# Patient Record
Sex: Male | Born: 1955 | Race: Black or African American | Hispanic: No | Marital: Single | Smoking: Never smoker
Health system: Southern US, Community
[De-identification: ages and names within clinical notes are randomized; demographics above are authoritative.]

---

## 2015-09-04 ENCOUNTER — Emergency Department (HOSPITAL_COMMUNITY): Payer: PRIVATE HEALTH INSURANCE

## 2015-09-04 ENCOUNTER — Encounter (HOSPITAL_COMMUNITY): Payer: Self-pay | Admitting: Emergency Medicine

## 2015-09-04 DIAGNOSIS — J45901 Unspecified asthma with (acute) exacerbation: Secondary | ICD-10-CM | POA: Diagnosis not present

## 2015-09-04 DIAGNOSIS — I10 Essential (primary) hypertension: Secondary | ICD-10-CM | POA: Insufficient documentation

## 2015-09-04 DIAGNOSIS — R05 Cough: Secondary | ICD-10-CM | POA: Diagnosis present

## 2015-09-04 LAB — BASIC METABOLIC PANEL
ANION GAP: 6 (ref 5–15)
BUN: 18 mg/dL (ref 6–20)
CALCIUM: 9.7 mg/dL (ref 8.9–10.3)
CO2: 29 mmol/L (ref 22–32)
Chloride: 106 mmol/L (ref 101–111)
Creatinine, Ser: 1.11 mg/dL (ref 0.61–1.24)
GLUCOSE: 105 mg/dL — AB (ref 65–99)
Potassium: 3.4 mmol/L — ABNORMAL LOW (ref 3.5–5.1)
Sodium: 141 mmol/L (ref 135–145)

## 2015-09-04 LAB — CBC
HCT: 48.8 % (ref 39.0–52.0)
HEMOGLOBIN: 15.4 g/dL (ref 13.0–17.0)
MCH: 27.6 pg (ref 26.0–34.0)
MCHC: 31.6 g/dL (ref 30.0–36.0)
MCV: 87.6 fL (ref 78.0–100.0)
Platelets: 157 10*3/uL (ref 150–400)
RBC: 5.57 MIL/uL (ref 4.22–5.81)
RDW: 13 % (ref 11.5–15.5)
WBC: 6.9 10*3/uL (ref 4.0–10.5)

## 2015-09-04 LAB — I-STAT TROPONIN, ED: TROPONIN I, POC: 0.01 ng/mL (ref 0.00–0.08)

## 2015-09-04 NOTE — ED Notes (Signed)
Pt speaks [place] (n[place]ian). Friends at bedside to interpret. Pt has had cough x 2 months with chest pain. Pt sts dry cough. Friends report patient gets SOB frequently at home. Resp e/u at this time. PT also noted to be hypertensive 269/149 in triage with no known history.

## 2015-09-05 ENCOUNTER — Emergency Department (HOSPITAL_COMMUNITY)
Admission: EM | Admit: 2015-09-05 | Discharge: 2015-09-05 | Disposition: A | Discharge status: Home / Self Care | Payer: PRIVATE HEALTH INSURANCE | Attending: Emergency Medicine | Admitting: Emergency Medicine

## 2015-09-05 DIAGNOSIS — J45901 Unspecified asthma with (acute) exacerbation: Secondary | ICD-10-CM

## 2015-09-05 DIAGNOSIS — I10 Essential (primary) hypertension: Secondary | ICD-10-CM

## 2015-09-05 MED ORDER — ALBUTEROL SULFATE (2.5 MG/3ML) 0.083% IN NEBU
2.5000 mg | INHALATION_SOLUTION | Freq: Once | RESPIRATORY_TRACT | Status: AC
  Administered 2015-09-05: 2.5 mg via RESPIRATORY_TRACT
  Filled 2015-09-05: qty 3

## 2015-09-05 MED ORDER — HYDROCHLOROTHIAZIDE 25 MG PO TABS: 25.0000 mg | ORAL_TABLET | Freq: Every day | ORAL | Status: DC

## 2015-09-05 MED ORDER — ALBUTEROL SULFATE HFA 108 (90 BASE) MCG/ACT IN AERS: 1.0000 | INHALATION_SPRAY | Freq: Four times a day (QID) | RESPIRATORY_TRACT | Status: AC | PRN

## 2015-09-05 MED ORDER — PREDNISONE 50 MG PO TABS: ORAL_TABLET | ORAL | Status: DC

## 2015-09-05 MED ORDER — HYDROCHLOROTHIAZIDE 25 MG PO TABS
25.0000 mg | ORAL_TABLET | Freq: Every day | ORAL | Status: DC
  Administered 2015-09-05: 25 mg via ORAL
  Filled 2015-09-05: qty 1

## 2015-09-05 MED ORDER — PREDNISONE 20 MG PO TABS
60.0000 mg | ORAL_TABLET | Freq: Once | ORAL | Status: AC
  Administered 2015-09-05: 60 mg via ORAL
  Filled 2015-09-05: qty 3

## 2015-09-05 MED ORDER — POTASSIUM CHLORIDE CRYS ER 10 MEQ PO TBCR: 10.0000 meq | EXTENDED_RELEASE_TABLET | Freq: Every day | ORAL | Status: AC

## 2015-09-05 MED ORDER — IPRATROPIUM-ALBUTEROL 0.5-2.5 (3) MG/3ML IN SOLN
3.0000 mL | Freq: Once | RESPIRATORY_TRACT | Status: AC
  Administered 2015-09-05: 3 mL via RESPIRATORY_TRACT
  Filled 2015-09-05: qty 3

## 2015-09-05 MED ORDER — ALBUTEROL SULFATE HFA 108 (90 BASE) MCG/ACT IN AERS
2.0000 | INHALATION_SPRAY | Freq: Once | RESPIRATORY_TRACT | Status: AC
  Administered 2015-09-05: 2 via RESPIRATORY_TRACT
  Filled 2015-09-05: qty 6.7

## 2015-09-05 NOTE — ED Notes (Signed)
Patient ambulated hallway with no difficulty.  O2 saturation remained at 99%

## 2015-09-05 NOTE — Discharge Instructions (Signed)
Asthma, Adult Asthma is a condition of the lungs in which the airways tighten and narrow. Asthma can make it hard to breathe. Asthma cannot be cured, but medicine and lifestyle changes can help control it. Asthma may be started (triggered) by:  Animal skin flakes (dander).  Dust.  Cockroaches.  Pollen.  Mold.  Smoke.  Cleaning products.  Hair sprays or aerosol sprays.  Paint fumes or strong smells.  Cold air, weather changes, and winds.  Crying or laughing hard.  Stress.  Certain medicines or drugs.  Foods, such as dried fruit, potato chips, and sparkling grape juice.  Infections or conditions (colds, flu).  Exercise.  Certain medical conditions or diseases.  Exercise or tiring activities. HOME CARE   Take medicine as told by your doctor.  Use a peak flow meter as told by your doctor. A peak flow meter is a tool that measures how well the lungs are working.  Record and keep track of the peak flow meter's readings.  Understand and use the asthma action plan. An asthma action plan is a written plan for taking care of your asthma and treating your attacks.  To help prevent asthma attacks:  Do not smoke. Stay away from secondhand smoke.  Change your heating and air conditioning filter often.  Limit your use of fireplaces and wood stoves.  Get rid of pests (such as roaches and mice) and their droppings.  Throw away plants if you see mold on them.  Clean your floors. Dust regularly. Use cleaning products that do not smell.  Have someone vacuum when you are not home. Use a vacuum cleaner with a HEPA filter if possible.  Replace carpet with wood, tile, or vinyl flooring. Carpet can trap animal skin flakes and dust.  Use allergy-proof pillows, mattress covers, and box spring covers.  Wash bed sheets and blankets every week in hot water and dry them in a dryer.  Use blankets that are made of polyester or cotton.  Clean bathrooms and kitchens with bleach.  If possible, have someone repaint the walls in these rooms with mold-resistant paint. Keep out of the rooms that are being cleaned and painted.  Wash hands often. GET HELP IF:  You have make a whistling sound when breaking (wheeze), have shortness of breath, or have a cough even if taking medicine to prevent attacks.  The colored mucus you cough up (sputum) is thicker than usual.  The colored mucus you cough up changes from clear or white to yellow, green, gray, or bloody.  You have problems from the medicine you are taking such as:  A rash.  Itching.  Swelling.  Trouble breathing.  You need reliever medicines more than 2-3 times a week.  Your peak flow measurement is still at 50-79% of your personal best after following the action plan for 1 hour.  You have a fever. GET HELP RIGHT AWAY IF:   You seem to be worse and are not responding to medicine during an asthma attack.  You are short of breath even at rest.  You get short of breath when doing very little activity.  You have trouble eating, drinking, or talking.  You have chest pain.  You have a fast heartbeat.  Your lips or fingernails start to turn blue.  You are light-headed, dizzy, or faint.  Your peak flow is less than 50% of your personal best.   This information is not intended to replace advice given to you by your health care provider. Make sure   you discuss any questions you have with your health care provider.   Document Released: 09/07/2007 Document Revised: 12/10/2014 Document Reviewed: 10/18/2012 Elsevier Interactive Patient Education 2016 Elsevier Inc.  

## 2015-09-05 NOTE — ED Notes (Signed)
Patient Alert and oriented X4. Stable and ambulatory. Patient and friend verbalized understanding of the discharge instructions.  Patient belongings were taken by the patient. Tried to use Interpreter phone but did not have interpreter available.  MD at bedside for discharge instructions.

## 2015-09-05 NOTE — ED Provider Notes (Signed)
CSN: [id number]     Arrival date & time 09/04/15  2214 History  By signing my name below, I, Doreatha Martin, attest that this documentation has been prepared under the direction and in the presence of Zadie Rhine, MD. Electronically Signed: Doreatha Martin, ED Scribe. 09/05/2015. 12:35 AM.    Chief Complaint  Patient presents with  . Cough  . Shortness of Breath  . Chest Pain   Patient is a 60 y.o. male presenting with cough and chest pain. The history is provided by the patient. A language interpreter was used Designer, jewellery ([place]) Interpreter # [phone]).  Cough Cough characteristics:  Dry Severity:  Mild Onset quality:  Gradual Duration:  4 weeks Timing:  Intermittent Chronicity:  Recurrent Smoker: no   Context: with activity   Relieved by:  None tried Worsened by:  Activity Ineffective treatments:  None tried Associated symptoms: chest pain and shortness of breath   Associated symptoms: no fever and no headaches   Risk factors: no recent travel   Chest Pain Associated symptoms: cough (secondary to cough) and shortness of breath   Associated symptoms: no fever and no headache    HPI Comments: Gary Duncan is a 60 y.o. male with h/o asthma (unmedicated) who presents to the Emergency Department complaining of intermittent dry cough for a month with associated exertional SOB. Pt also complains of chest tenderness secondary to coughing. Pt reports he has taken Advil intermittently for his symptoms with mild to moderate relief. He reports his current symptoms are similar to prior asthma exacerbations. Pt is a nonsmoker. No recent international travel in the past month. Otherwise healthy. No daily medications. Pt denies fever, HA, abdominal pain, leg swelling, hemoptysis, CP when not coughing.   PMH - Asthma Soc hx - no recent international travel Social History  Substance Use Topics  . Smoking status: Never Smoker   . Smokeless tobacco: None  . Alcohol Use: No    Review of Systems   Constitutional: Negative for fever.  Respiratory: Positive for cough (secondary to cough) and shortness of breath.   Cardiovascular: Positive for chest pain. Negative for leg swelling.  Neurological: Negative for headaches.  All other systems reviewed and are negative.  Allergies  Review of patient's allergies indicates no known allergies.  Home Medications   Prior to Admission medications   Not on File   BP 269/149 mmHg  Pulse 103  Temp(Src) 98.3 F (36.8 C) (Oral)  Resp 16  SpO2 97% Physical Exam CONSTITUTIONAL: Well developed/well nourished HEAD: Normocephalic/atraumatic EYES: EOMI/PERRL ENMT: Mucous membranes moist NECK: supple no meningeal signs SPINE/BACK:entire spine nontender CV: S1/S2 noted, no murmurs/rubs/gallops noted LUNGS: Wheezing bilaterally.  ABDOMEN: soft, nontender, no rebound or guarding, bowel sounds noted throughout abdomen GU:no cva tenderness NEURO: Pt is awake/alert/appropriate, moves all extremitiesx4.  No facial droop.   EXTREMITIES: pulses normal/equal, full ROM; no lower extremity edema.  SKIN: warm, color normal PSYCH: no abnormalities of mood noted, alert and oriented to situation   ED Course  Procedures  Medications  hydrochlorothiazide (HYDRODIURIL) tablet 25 mg (25 mg Oral Given 09/05/15 0103)  ipratropium-albuterol (DUONEB) 0.5-2.5 (3) MG/3ML nebulizer solution 3 mL (3 mLs Nebulization Given 09/05/15 0056)  albuterol (PROVENTIL) (2.5 MG/3ML) 0.083% nebulizer solution 2.5 mg (2.5 mg Nebulization Given 09/05/15 0056)  predniSONE (DELTASONE) tablet 60 mg (60 mg Oral Given 09/05/15 0056)  albuterol (PROVENTIL HFA;VENTOLIN HFA) 108 (90 Base) MCG/ACT inhaler 2 puff (2 puffs Inhalation Given 09/05/15 0207)    DIAGNOSTIC STUDIES: Oxygen Saturation is 97% on  RA, normal by my interpretation.    COORDINATION OF CARE: 12:31 AM Discussed treatment plan with pt at bedside which includes lab work, CXR, EKG and pt agreed to plan. Will order dose of HCTZ,  Duoneb tx, Proventil and Prednisone prior to d/c. Will provide PCP resources for f/u.  2:12 AM Pt improved Wheezing improved but still present No distress noted I will start him on BP meds due to his untreated HTN.  Will also start KCL for 5 days due to mild hypokalemia and likely this will worsen with HCTZ He has no signs of hypertensive emergency, no distress noted Albuterol/prednisone also started [facility] Discussed return precautions At time of discharge, [place] interpreter not available by interpreter phone and patient does not speak any other languages.  His friend at bedside was able to help interpret   Labs Review Labs Reviewed  BASIC METABOLIC PANEL - Abnormal; Notable for the following:    Potassium 3.4 (*)    Glucose, Bld 105 (*)    All other components within normal limits  CBC  I-STAT TROPOININ, ED    Imaging Review Dg Chest 2 View  09/04/2015  CLINICAL DATA:  Chest pain EXAM: CHEST  2 VIEW COMPARISON:  None. FINDINGS: Normal heart size. Mild prominence of the ascending aorta without definitive enlargement. Nipple shadows are noted. There is no edema, consolidation, effusion, or pneumothorax. No osseous finding to explain acute chest pain. IMPRESSION: No evidence of active disease. Electronically Signed   By: Marnee SpringJonathon  Watts M.D.   On: 09/04/2015 23:06   I have personally reviewed and evaluated these images and lab results as part of my medical decision-making.   EKG Interpretation   Date/Time:  Friday September 04 2015 22:24:44 EDT Ventricular Rate:  95 PR Interval:  146 QRS Duration: 84 QT Interval:  356 QTC Calculation: 447 R Axis:   61 Text Interpretation:  Normal sinus rhythm Possible Left atrial enlargement  Left ventricular hypertrophy with repolarization abnormality Abnormal ECG  No previous ECGs available Confirmed by Bebe ShaggyWICKLINE  MD, Dorinda HillNALD ([id number]) on  09/04/2015 11:01:38 PM      MDM   Final diagnoses:  Asthma attack   Essential hypertension    Nursing notes including past medical history and social history reviewed and considered in documentation xrays/imaging reviewed by myself and considered during evaluation Labs/vital reviewed myself and considered during evaluation   I personally performed the services described in this documentation, which was scribed in my presence. The recorded information has been reviewed and is accurate.       Zadie Rhineonald Kyle Luppino, MD 09/05/15 [phone]

## 2015-10-11 ENCOUNTER — Emergency Department (HOSPITAL_COMMUNITY)
Admission: EM | Admit: 2015-10-11 | Discharge: 2015-10-11 | Disposition: A | Discharge status: Home / Self Care | Payer: PRIVATE HEALTH INSURANCE | Attending: Physician Assistant | Admitting: Physician Assistant

## 2015-10-11 ENCOUNTER — Emergency Department (HOSPITAL_COMMUNITY): Payer: PRIVATE HEALTH INSURANCE

## 2015-10-11 ENCOUNTER — Encounter (HOSPITAL_COMMUNITY): Payer: Self-pay | Admitting: Emergency Medicine

## 2015-10-11 DIAGNOSIS — R059 Cough, unspecified: Secondary | ICD-10-CM

## 2015-10-11 DIAGNOSIS — R0602 Shortness of breath: Secondary | ICD-10-CM

## 2015-10-11 DIAGNOSIS — Z79899 Other long term (current) drug therapy: Secondary | ICD-10-CM | POA: Diagnosis not present

## 2015-10-11 DIAGNOSIS — J45901 Unspecified asthma with (acute) exacerbation: Secondary | ICD-10-CM | POA: Diagnosis not present

## 2015-10-11 DIAGNOSIS — R05 Cough: Secondary | ICD-10-CM

## 2015-10-11 DIAGNOSIS — R079 Chest pain, unspecified: Secondary | ICD-10-CM | POA: Diagnosis present

## 2015-10-11 LAB — BASIC METABOLIC PANEL
ANION GAP: 8 (ref 5–15)
BUN: 12 mg/dL (ref 6–20)
CALCIUM: 9.7 mg/dL (ref 8.9–10.3)
CO2: 24 mmol/L (ref 22–32)
Chloride: 107 mmol/L (ref 101–111)
Creatinine, Ser: 1.13 mg/dL (ref 0.61–1.24)
Glucose, Bld: 118 mg/dL — ABNORMAL HIGH (ref 65–99)
Potassium: 3.7 mmol/L (ref 3.5–5.1)
SODIUM: 139 mmol/L (ref 135–145)

## 2015-10-11 LAB — CBC
HCT: 46.7 % (ref 39.0–52.0)
HEMOGLOBIN: 15.2 g/dL (ref 13.0–17.0)
MCH: 28.7 pg (ref 26.0–34.0)
MCHC: 32.5 g/dL (ref 30.0–36.0)
MCV: 88.3 fL (ref 78.0–100.0)
Platelets: 165 10*3/uL (ref 150–400)
RBC: 5.29 MIL/uL (ref 4.22–5.81)
RDW: 13.1 % (ref 11.5–15.5)
WBC: 5.5 10*3/uL (ref 4.0–10.5)

## 2015-10-11 LAB — I-STAT TROPONIN, ED: TROPONIN I, POC: 0.01 ng/mL (ref 0.00–0.08)

## 2015-10-11 MED ORDER — PREDNISONE 20 MG PO TABS
60.0000 mg | ORAL_TABLET | Freq: Once | ORAL | Status: AC
  Administered 2015-10-11: 60 mg via ORAL
  Filled 2015-10-11: qty 3

## 2015-10-11 MED ORDER — PREDNISONE 20 MG PO TABS: 60.0000 mg | ORAL_TABLET | Freq: Every day | ORAL | Status: AC

## 2015-10-11 MED ORDER — ALBUTEROL SULFATE HFA 108 (90 BASE) MCG/ACT IN AERS
1.0000 | INHALATION_SPRAY | Freq: Once | RESPIRATORY_TRACT | Status: AC
  Administered 2015-10-11: 2 via RESPIRATORY_TRACT
  Filled 2015-10-11: qty 6.7

## 2015-10-11 MED ORDER — ALBUTEROL SULFATE HFA 108 (90 BASE) MCG/ACT IN AERS: 2.0000 | INHALATION_SPRAY | RESPIRATORY_TRACT | Status: DC | PRN

## 2015-10-11 MED ORDER — HYDROCHLOROTHIAZIDE 25 MG PO TABS: 25.0000 mg | ORAL_TABLET | Freq: Every day | ORAL | Status: AC

## 2015-10-11 MED ORDER — ALBUTEROL SULFATE (2.5 MG/3ML) 0.083% IN NEBU
2.5000 mg | INHALATION_SOLUTION | Freq: Once | RESPIRATORY_TRACT | Status: AC
  Administered 2015-10-11: 2.5 mg via RESPIRATORY_TRACT
  Filled 2015-10-11: qty 3

## 2015-10-11 NOTE — ED Notes (Addendum)
Pt c/o chest pain with non productive cough x 1 week. Pt speaks [place]. Pt does have a primary medical doctor and has only seen a doctor in his lifetime when he was last here.

## 2015-10-11 NOTE — Discharge Instructions (Signed)
Medications: Hydrochlorothiazide, albuterol, prednisone  Treatment: Take hydrochlorothiazide once daily as prescribed. Take prednisone as prescribed once daily for 5 days. Use albuterol inhaler every 4-6 hours as needed for shortness of breath, chest tightness, wheezing.  Follow-up: Please follow-up and establish care with a primary care provider as soon as possible by calling the number circled on your discharge paperwork. Please return to the emergency department if you develop any new or worsening symptoms.   Asthma, Adult Asthma is a recurring condition in which the airways tighten and narrow. Asthma can make it difficult to breathe. It can cause coughing, wheezing, and shortness of breath. Asthma episodes, also called asthma attacks, range from minor to life-threatening. Asthma cannot be cured, but medicines and lifestyle changes can help control it. CAUSES Asthma is believed to be caused by inherited (genetic) and environmental factors, but its exact cause is unknown. Asthma may be triggered by allergens, lung infections, or irritants in the air. Asthma triggers are different for each person. Common triggers include:   Animal dander.  Dust mites.  Cockroaches.  Pollen from trees or grass.  Mold.  Smoke.  Air pollutants such as dust, household cleaners, hair sprays, aerosol sprays, paint fumes, strong chemicals, or strong odors.  Cold air, weather changes, and winds (which increase molds and pollens in the air).  Strong emotional expressions such as crying or laughing hard.  Stress.  Certain medicines (such as aspirin) or types of drugs (such as beta-blockers).  Sulfites in foods and drinks. Foods and drinks that may contain sulfites include dried fruit, potato chips, and sparkling grape juice.  Infections or inflammatory conditions such as the flu, a cold, or an inflammation of the nasal membranes (rhinitis).  Gastroesophageal reflux disease (GERD).  Exercise or  strenuous activity. SYMPTOMS Symptoms may occur immediately after asthma is triggered or many hours later. Symptoms include:  Wheezing.  Excessive nighttime or early morning coughing.  Frequent or severe coughing with a common cold.  Chest tightness.  Shortness of breath. DIAGNOSIS  The diagnosis of asthma is made by a review of your medical history and a physical exam. Tests may also be performed. These may include:  Lung function studies. These tests show how much air you breathe in and out.  Allergy tests.  Imaging tests such as X-rays. TREATMENT  Asthma cannot be cured, but it can usually be controlled. Treatment involves identifying and avoiding your asthma triggers. It also involves medicines. There are 2 classes of medicine used for asthma treatment:   Controller medicines. These prevent asthma symptoms from occurring. They are usually taken every day.  Reliever or rescue medicines. These quickly relieve asthma symptoms. They are used as needed and provide short-term relief. Your health care provider will help you create an asthma action plan. An asthma action plan is a written plan for managing and treating your asthma attacks. It includes a list of your asthma triggers and how they may be avoided. It also includes information on when medicines should be taken and when their dosage should be changed. An action plan may also involve the use of a device called a peak flow meter. A peak flow meter measures how well the lungs are working. It helps you monitor your condition. HOME CARE INSTRUCTIONS   Take medicines only as directed by your health care provider. Speak with your health care provider if you have questions about how or when to take the medicines.  Use a peak flow meter as directed by your health care provider.  Record and keep track of readings.  Understand and use the action plan to help minimize or stop an asthma attack without needing to seek medical  care.  Control your home environment in the following ways to help prevent asthma attacks:  Do not smoke. Avoid being exposed to secondhand smoke.  Change your heating and air conditioning filter regularly.  Limit your use of fireplaces and wood stoves.  Get rid of pests (such as roaches and mice) and their droppings.  Throw away plants if you see mold on them.  Clean your floors and dust regularly. Use unscented cleaning products.  Try to have someone else vacuum for you regularly. Stay out of rooms while they are being vacuumed and for a short while afterward. If you vacuum, use a dust mask from a hardware store, a double-layered or microfilter vacuum cleaner bag, or a vacuum cleaner with a HEPA filter.  Replace carpet with wood, tile, or vinyl flooring. Carpet can trap dander and dust.  Use allergy-proof pillows, mattress covers, and box spring covers.  Wash bed sheets and blankets every week in hot water and dry them in a dryer.  Use blankets that are made of polyester or cotton.  Clean bathrooms and kitchens with bleach. If possible, have someone repaint the walls in these rooms with mold-resistant paint. Keep out of the rooms that are being cleaned and painted.  Wash hands frequently. SEEK MEDICAL CARE IF:   You have wheezing, shortness of breath, or a cough even if taking medicine to prevent attacks.  The colored mucus you cough up (sputum) is thicker than usual.  Your sputum changes from clear or white to yellow, green, gray, or bloody.  You have any problems that may be related to the medicines you are taking (such as a rash, itching, swelling, or trouble breathing).  You are using a reliever medicine more than 2-3 times per week.  Your peak flow is still at 50-79% of your personal best after following your action plan for 1 hour.  You have a fever. SEEK IMMEDIATE MEDICAL CARE IF:   You seem to be getting worse and are unresponsive to treatment during an asthma  attack.  You are short of breath even at rest.  You get short of breath when doing very little physical activity.  You have difficulty eating, drinking, or talking due to asthma symptoms.  You develop chest pain.  You develop a fast heartbeat.  You have a bluish color to your lips or fingernails.  You are light-headed, dizzy, or faint.  Your peak flow is less than 50% of your personal best.   This information is not intended to replace advice given to you by your health care provider. Make sure you discuss any questions you have with your health care provider.   Document Released: 03/21/2005 Document Revised: 12/10/2014 Document Reviewed: 10/18/2012 Elsevier Interactive Patient Education [employer]

## 2015-10-11 NOTE — ED Notes (Signed)
Called to follow up on xray.  Will come to get pt.

## 2015-10-11 NOTE — ED Provider Notes (Signed)
Patient originally seen by Eliezer Mccoy, PA-C.   The patient is here with hypertension, cough, wheezing for the past week with dyspnea. He was started on HCTZ but was unable to see [place] because he doesn't want to go there. Dr. Thomasene Lot and Lanny Cramp, PA-C do not have suspicion for PE, heart failure or ACS.  He reports improvement after two nebulizer treatments Will be discharged with prednisone, albuterol HFA inhaler, and HCTZ x 30 tabs Social work consult placed to help patient arrange outpatient follow-up.    I-stat troponin, ED (Final result) Component (Lab Inquiry)    Collection Time Result Time Troponin i, poc Comment 3   10/11/15 17:50:00 10/11/15 17:57:58 0.01      (Comment)    Due to the release kinetics of cTnI,  a negative result within the first hours  of the onset of symptoms does not rule out  myocardial infarction with certainty.  If myocardial infarction is still suspected,  repeat the test at appropriate intervals.                   Basic metabolic panel (Final result)Abnormal Component (Lab Inquiry)    Collection Time Result Time NA K CL CO2 GLUCOSE   10/11/15 17:32:00 10/11/15 18:21:44 139 3.7 107 24 118 (H)      Collection Time Result Time BUN Creatinine, Ser CALCIUM GFR calc non Af Amer GFR calc Af Amer   10/11/15 17:32:00 10/11/15 18:21:44 12 1.13 9.7 >60  >60    (Comment)    (NOTE)  The eGFR has been calculated using the CKD EPI equation.  This calculation has not been validated in all clinical situations.  eGFR's persistently <60 mL/min signify possible Chronic Kidney  Disease.            Collection Time Result Time Anion gap   10/11/15 17:32:00 10/11/15 18:21:44 8             CBC (Final result) Component (Lab Inquiry)    Collection Time Result Time WBC RBC HGB HCT MCV   10/11/15 17:32:00 10/11/15 17:59:26 5.5 5.29 15.2 46.7 88.3      Collection Time Result Time MCH MCHC RDW PLT     10/11/15 17:32:00 10/11/15 17:59:26 28.7 32.5 13.1 165           Imaging Results       DG Chest 2 View (Final result) Result time: 10/11/15 20:36:59   Final result by Rad Results In Interface (10/11/15 20:36:59)   Narrative:   CLINICAL DATA: Productive cough for 1 week.  EXAM: CHEST 2 VIEW  COMPARISON: 09/04/2015  FINDINGS: The cardiac silhouette, mediastinal and hilar contours are within normal limits and stable. There is mild tortuosity of the thoracic aorta. The lungs are clear. No pleural effusion. The bony thorax is intact. Mild gaseous distention of transverse colon is noted.  IMPRESSION: No acute cardiopulmonary findings.   Electronically Signed By: Marijo Sanes M.D. On: 10/11/2015 20:36   KAELEM, BRACH LP:[id number] 11-Oct-2015 17:38:23 [facility]/ED ROUTINE RECORD Normal sinus rhythm Possible Left atrial enlargement Left ventricular hypertrophy with repolarization abnormality Abnormal ECG No significant change since last tracing Confirmed by [facility], COURTNEY ([id number]) on 10/11/2015 8:16:36 PM 65m/s 163mmV _0  9.0.4 12SL 241 CID: [id number] Confirmed By: CONorgerate 68 BPM PR interval 150 ms QRS duration 92 ms QT/QTc 400/425 ms P-R-T axes 64 50 216  Medications  albuterol (PROVENTIL HFA;VENTOLIN HFA) 108 (90 Base) MCG/ACT inhaler 2 puff (not administered)  albuterol (  PROVENTIL) (2.5 MG/3ML) 0.083% nebulizer solution 2.5 mg (2.5 mg Nebulization Given 10/11/15 1933)  albuterol (PROVENTIL) (2.5 MG/3ML) 0.083% nebulizer solution 2.5 mg (2.5 mg Nebulization Given 10/11/15 2045)  albuterol (PROVENTIL HFA;VENTOLIN HFA) 108 (90 Base) MCG/ACT inhaler 1-2 puff (2 puffs Inhalation Given 10/11/15 2044)  predniSONE (DELTASONE) tablet 60 mg (60 mg Oral Given 10/11/15 2045)    I discussed results, diagnoses and plan with Atanacio Dismore. They voice there understanding and questions were answered. We discussed follow-up  recommendations and return precautions.   Delos Haring, PA-C 10/11/15 2139  [place], MD 10/11/15 [phone]

## 2015-10-11 NOTE — ED Notes (Signed)
Patient refused interpreter phone for discharge.  Requested his family member to translate.  All instructions verified for understanding, and all questions answered.  Patient able to ambulate independently

## 2015-10-11 NOTE — ED Provider Notes (Signed)
CSN: [id number]     Arrival date & time 10/11/15  1730 History   First MD Initiated Contact with Patient 10/11/15 1815     Chief Complaint  Patient presents with  . Chest Pain  . Cough     (Consider location/radiation/quality/duration/timing/severity/associated sxs/prior Treatment) HPI Comments: Patient is a 60 year old male with history of asthma and hypertension who presents with a one-week history of dyspnea on exertion, dry cough, lightheadedness on exertion, and chest pain with cough. Patient states he ran out of the inhaler 3 weeks ago that was given to him at his last visit where he was diagnosed. He also ran out of his high blood pressure medication, hydrochlorothiazide, 3 weeks ago. Patient states that his symptoms are similar to the first time he was here and seen and diagnosed with asthma. Patient denies any long trips, surgeries, cancer, leg pain or swelling. Patient also denies any headaches, dizziness, abdominal pain, nausea, vomiting, dysuria, hematuria. Patient [facility].  Patient is a 60 y.o. male presenting with chest pain and cough. The history is provided by the patient and a friend. The history is limited by a language barrier. Language interpreter used: family/friend.  Chest Pain Associated symptoms: cough and shortness of breath (on exertion)   Associated symptoms: no abdominal pain, no back pain, no dizziness, no fever, no headache, no nausea and not vomiting   Cough Associated symptoms: chest pain (only with cough) and shortness of breath (on exertion)   Associated symptoms: no chills, no fever, no headaches and no rash     History reviewed. No pertinent past medical history. History reviewed. No pertinent past surgical history. No family history on file. Social History  Substance Use Topics  . Smoking status: Never Smoker   . Smokeless tobacco: None  . Alcohol Use: No    Review of Systems   Constitutional: Negative for fever and chills.  HENT: Negative for facial swelling.   Respiratory: Positive for cough and shortness of breath (on exertion).   Cardiovascular: Positive for chest pain (only with cough).  Gastrointestinal: Negative for nausea, vomiting and abdominal pain.  Genitourinary: Negative for dysuria.  Musculoskeletal: Negative for back pain.  Skin: Negative for rash and wound.  Neurological: Positive for light-headedness (on exertion). Negative for dizziness and headaches.  Psychiatric/Behavioral: The patient is not nervous/anxious.       Allergies  Review of patient's allergies indicates no known allergies.  Home Medications   Prior to Admission medications   Medication Sig Start Date End Date Taking? Authorizing Provider  albuterol (PROVENTIL HFA;VENTOLIN HFA) 108 (90 Base) MCG/ACT inhaler Inhale 1-2 puffs into the lungs every 6 (six) hours as needed for wheezing or shortness of breath. 09/05/15  Yes Zadie Rhineonald Wickline, MD  ibuprofen (ADVIL,MOTRIN) 200 MG tablet Take 400 mg by mouth every 6 (six) hours as needed (pain).   Yes Historical Provider, MD  Pseudoeph-Doxylamine-DM-APAP (NYQUIL PO) Take 1 each by mouth at bedtime as needed (cough/ cold).   Yes Historical Provider, MD  hydrochlorothiazide (HYDRODIURIL) 25 MG tablet Take 1 tablet (25 mg total) by mouth daily. 10/11/15   Emi HolesAlexandra M Madasyn Heath, PA-C  potassium chloride SA (K-DUR,KLOR-CON) 10 MEQ tablet Take 1 tablet (10 mEq total) by mouth daily. Patient not taking: Reported on 10/11/2015 09/05/15   Zadie Rhineonald Wickline, MD  predniSONE (DELTASONE) 20 MG tablet Take 3 tablets (60 mg total) by mouth daily. 10/11/15   Jahvon Gosline M Undrea Shipes, PA-C   BP 168/101  mmHg  Pulse 60  Temp(Src) 99.1 F (37.3 C) (Oral)  Resp 17  SpO2 97% Physical Exam  Constitutional: He appears well-developed and well-nourished. No distress.  HENT:  Head: Normocephalic and atraumatic.  Mouth/Throat: Oropharynx is clear and moist. No oropharyngeal exudate.   Eyes: Conjunctivae are normal. Pupils are equal, round, and reactive to light. Right eye exhibits no discharge. Left eye exhibits no discharge. No scleral icterus.  Neck: Normal range of motion. Neck supple. No thyromegaly present.  Cardiovascular: Normal rate, regular rhythm, normal heart sounds and intact distal pulses.  Exam reveals no gallop and no friction rub.   No murmur heard. Pulmonary/Chest: Effort normal. No stridor. No respiratory distress. He has wheezes (expiratory L lung). He has rhonchi (diffuse). He has no rales.  Abdominal: Soft. Bowel sounds are normal. He exhibits no distension. There is no tenderness. There is no rebound and no guarding.  Musculoskeletal: He exhibits no edema.  No peripheral edema noted, no leg or calf tenderness to palpation  Lymphadenopathy:    He has no cervical adenopathy.  Neurological: He is alert. Coordination normal.  Skin: Skin is warm and dry. No rash noted. He is not diaphoretic. No pallor.  Psychiatric: He has a normal mood and affect.  Nursing note and vitals reviewed.   ED Course  Procedures (including critical care time) Labs Review Labs Reviewed  BASIC METABOLIC PANEL - Abnormal; Notable for the following:    Glucose, Bld 118 (*)    All other components within normal limits  CBC  I-STAT TROPOININ, ED    Imaging Review Dg Chest 2 View  10/11/2015  CLINICAL DATA:  Productive cough for 1 week. EXAM: CHEST  2 VIEW COMPARISON:  09/04/2015 FINDINGS: The cardiac silhouette, mediastinal and hilar contours are within normal limits and stable. There is mild tortuosity of the thoracic aorta. The lungs are clear. No pleural effusion. The bony thorax is intact. Mild gaseous distention of transverse colon is noted. IMPRESSION: No acute cardiopulmonary findings. Electronically Signed   By: Rudie Meyer M.D.   On: 10/11/2015 20:36   I have personally reviewed and evaluated these images and lab results as part of my medical decision-making.    EKG Interpretation   Date/Time:  Sunday October 11 2015 17:38:23 EDT Ventricular Rate:  68 PR Interval:  150 QRS Duration: 92 QT Interval:  400 QTC Calculation: 425 R Axis:   50 Text Interpretation:  Normal sinus rhythm Possible Left atrial enlargement  Left ventricular hypertrophy with repolarization abnormality Abnormal ECG  No significant change since last tracing Confirmed by Kandis Mannan  ([id number]) on 10/11/2015 8:16:36 PM      MDM   CBC, BMP unremarkable. Troponin 0.01. CXR shows No acute cardio pulmonary findings. Patient reports his symptoms are at baseline after one albuterol treatment, however patient's lungs still have some rhonchi and wheezing. Will order one more albuterol treatment. At shift change, patient care transferred to Marlon Pel, PA-C for continued evaluation, follow up of second nebulizer tx and determination of disposition. Anticipate discharge if symptoms resolved and lungs clearer. Patient to follow-up and establish care with primary care provider. Social work consulted and would like patient to be called for assistance with finding a PCP. I discussed patient with Dr. Corlis Leak who is in agreement with plan.    Final diagnoses:  Asthma, unspecified asthma severity, with acute exacerbation  Shortness of breath  Cough        Emi Holes, PA-C 10/11/15 2045  Courteney Lyn Corlis Leak, MD  10/12/15 2312 

## 2018-04-11 IMAGING — DX DG CHEST 2V
2 series · 2 of 2 positions shown · non-contrast
Comparison: None.

CLINICAL DATA: Chest pain

EXAM:
CHEST  2 VIEW

[chest pa]
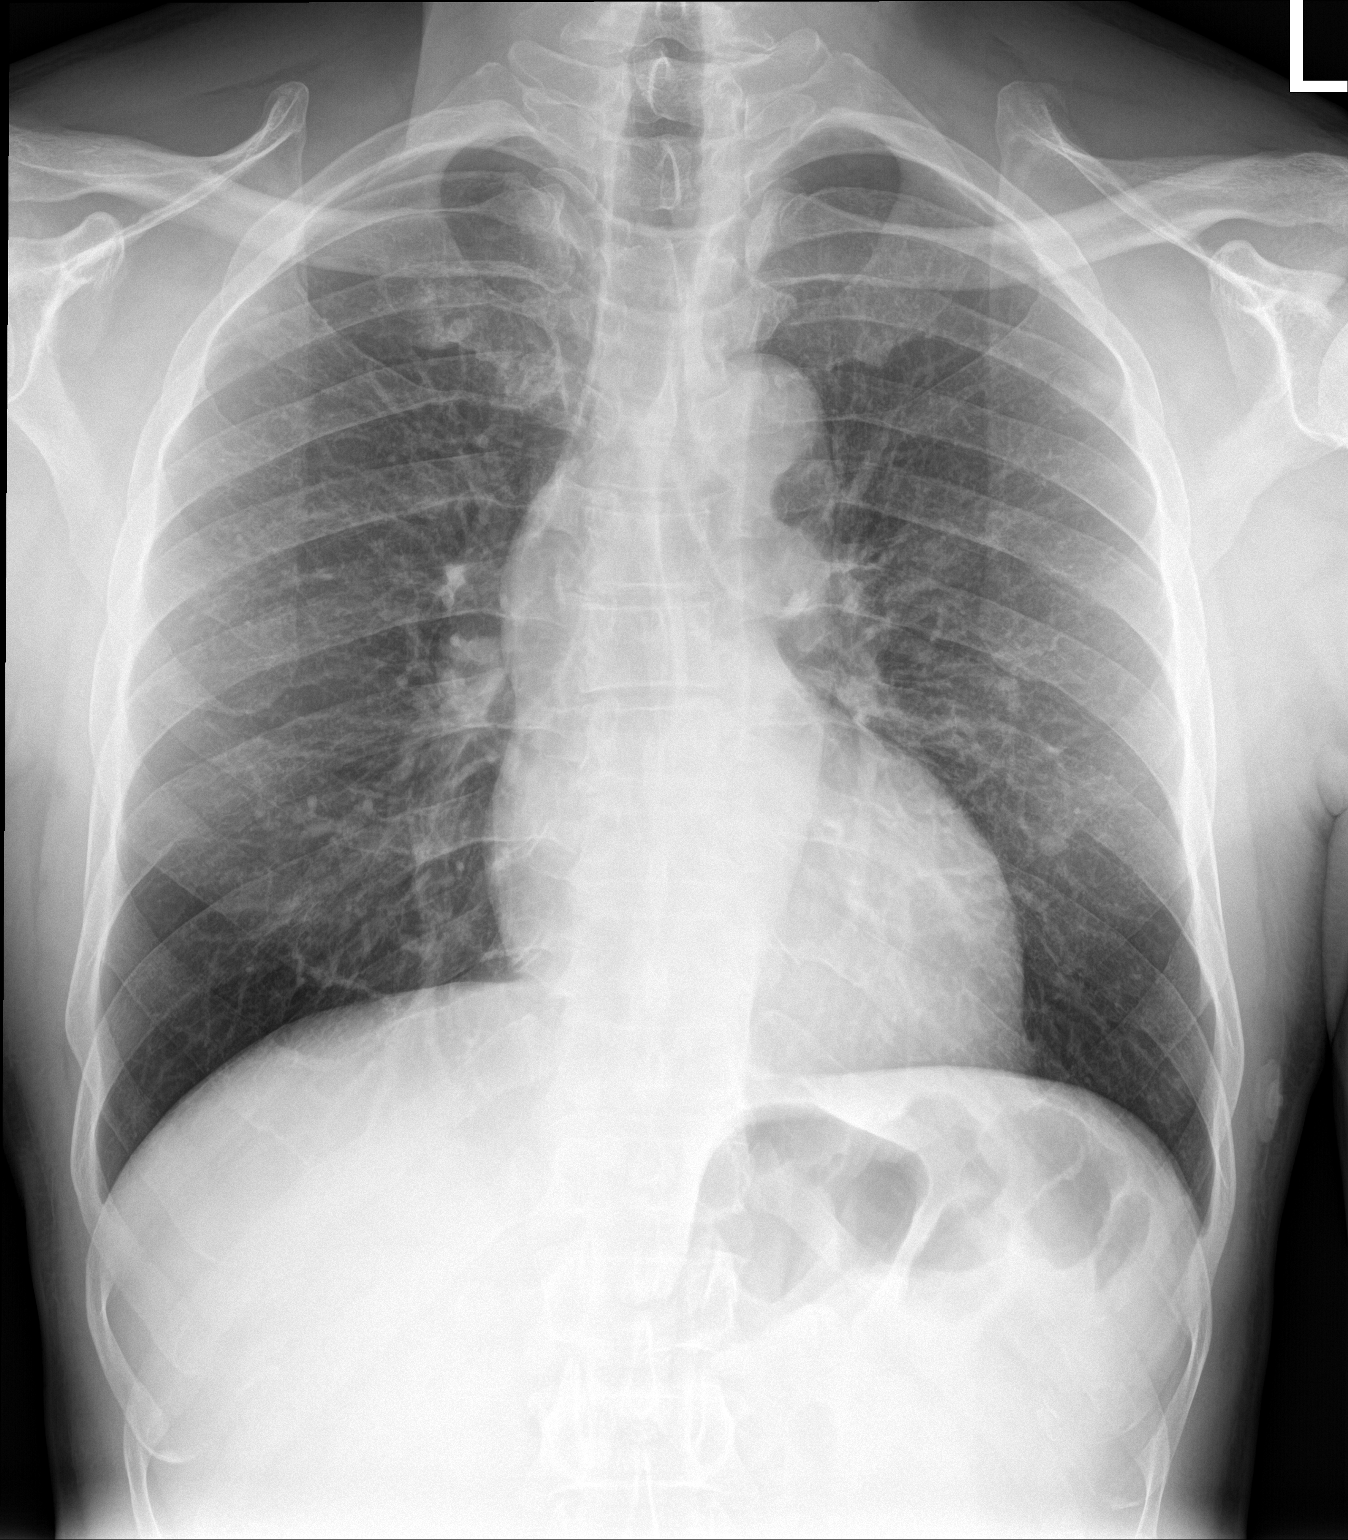

[chest lat]
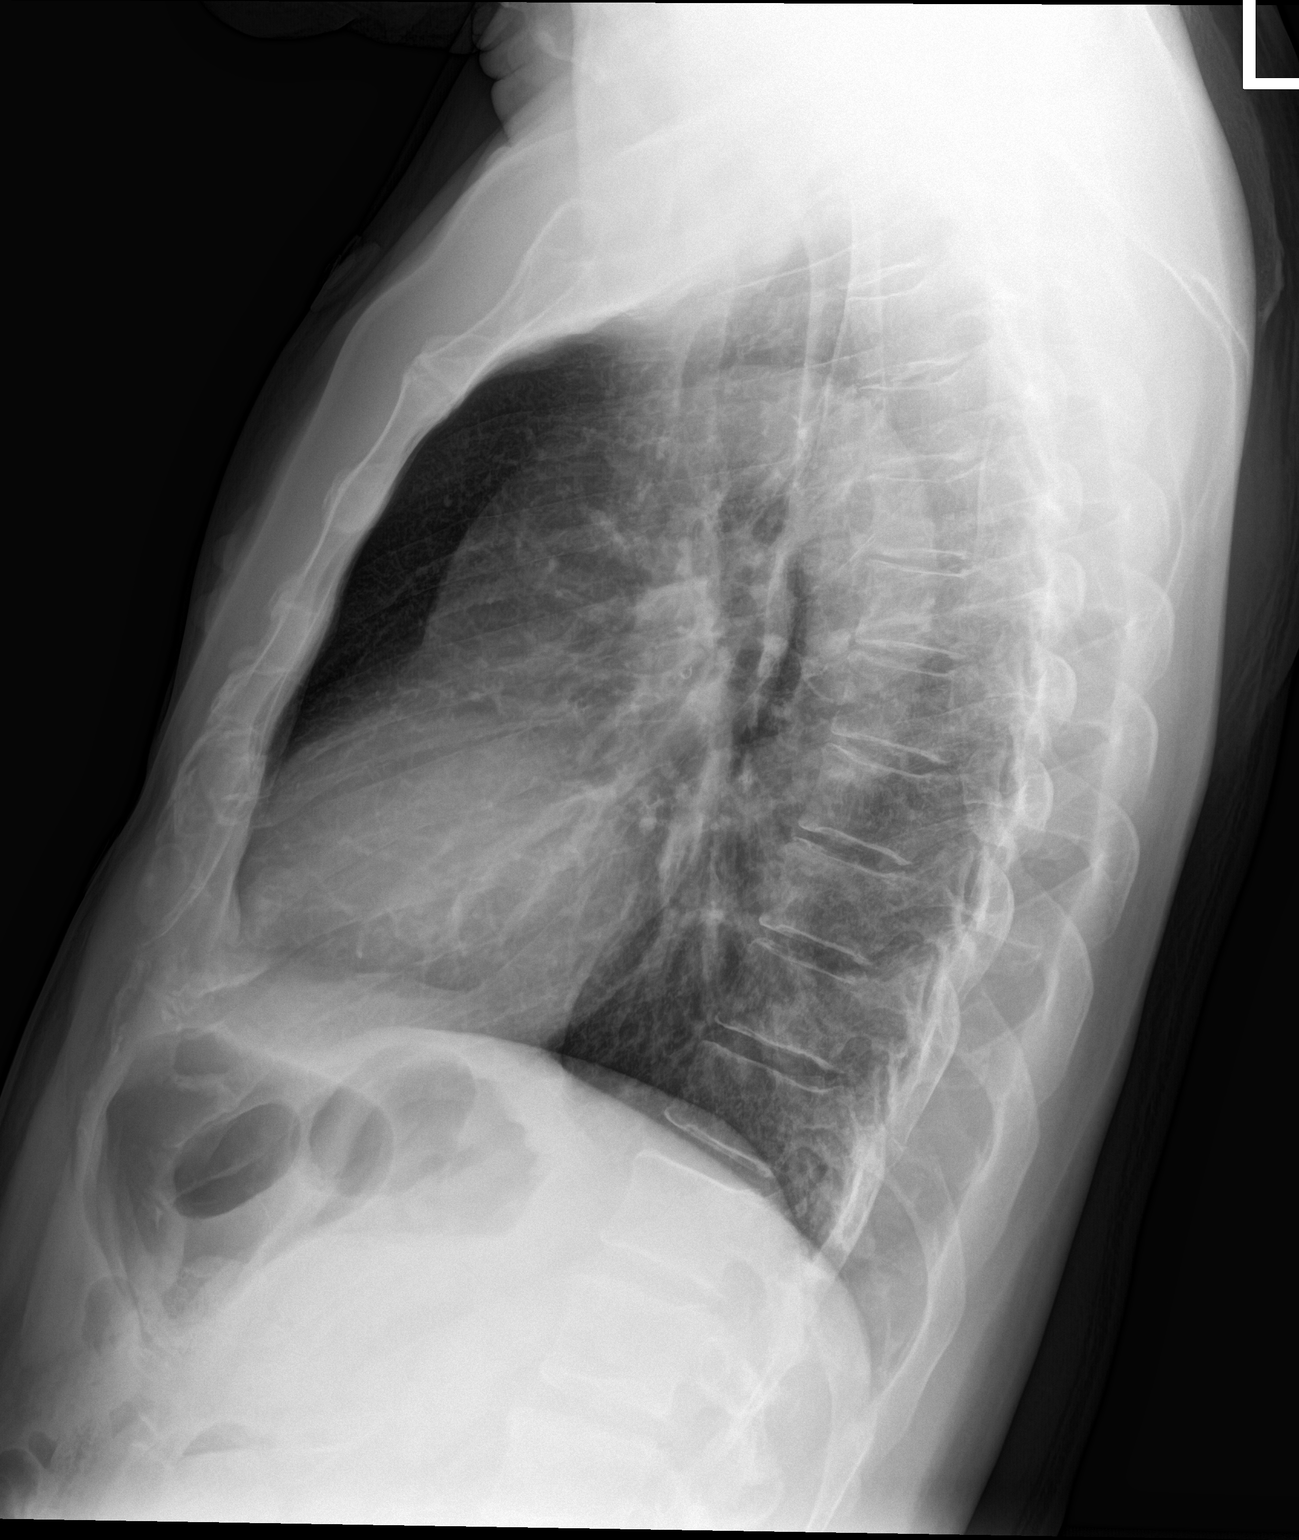

[2 of 2 positions shown; findings below may reference images not displayed]

FINDINGS: Normal heart size. Mild prominence of the ascending aorta without
definitive enlargement. Nipple shadows are noted. There is no edema,
consolidation, effusion, or pneumothorax. No osseous finding to
explain acute chest pain.
IMPRESSION: No evidence of active disease.
